# Patient Record
Sex: Male | Born: 1985 | Race: White | Hispanic: No | Marital: Single | Smoking: Current every day smoker
Health system: Southern US, Community
[De-identification: ages and names within clinical notes are randomized; demographics above are authoritative.]

---

## 2007-05-19 ENCOUNTER — Ambulatory Visit: Payer: Self-pay | Admitting: Family Medicine

## 2007-11-01 ENCOUNTER — Ambulatory Visit: Payer: Self-pay | Admitting: Internal Medicine

## 2007-11-06 ENCOUNTER — Ambulatory Visit: Payer: Self-pay | Admitting: Internal Medicine

## 2010-10-25 ENCOUNTER — Emergency Department: Payer: Self-pay | Admitting: Emergency Medicine

## 2013-08-28 ENCOUNTER — Emergency Department: Payer: Self-pay | Admitting: Emergency Medicine

## 2016-12-04 ENCOUNTER — Encounter: Payer: Self-pay | Admitting: Emergency Medicine

## 2016-12-04 ENCOUNTER — Emergency Department: Payer: Self-pay

## 2016-12-04 ENCOUNTER — Emergency Department
Admission: EM | Admit: 2016-12-04 | Discharge: 2016-12-04 | Disposition: A | Discharge status: Home / Self Care | Payer: Self-pay | Attending: Emergency Medicine | Admitting: Emergency Medicine

## 2016-12-04 DIAGNOSIS — T1490XA Injury, unspecified, initial encounter: Secondary | ICD-10-CM

## 2016-12-04 DIAGNOSIS — S32018A Other fracture of first lumbar vertebra, initial encounter for closed fracture: Secondary | ICD-10-CM | POA: Insufficient documentation

## 2016-12-04 DIAGNOSIS — M25552 Pain in left hip: Secondary | ICD-10-CM | POA: Insufficient documentation

## 2016-12-04 DIAGNOSIS — Y939 Activity, unspecified: Secondary | ICD-10-CM | POA: Insufficient documentation

## 2016-12-04 DIAGNOSIS — W1789XA Other fall from one level to another, initial encounter: Secondary | ICD-10-CM | POA: Insufficient documentation

## 2016-12-04 DIAGNOSIS — Y999 Unspecified external cause status: Secondary | ICD-10-CM | POA: Insufficient documentation

## 2016-12-04 DIAGNOSIS — S32009A Unspecified fracture of unspecified lumbar vertebra, initial encounter for closed fracture: Secondary | ICD-10-CM

## 2016-12-04 DIAGNOSIS — S32048A Other fracture of fourth lumbar vertebra, initial encounter for closed fracture: Secondary | ICD-10-CM | POA: Insufficient documentation

## 2016-12-04 DIAGNOSIS — Y929 Unspecified place or not applicable: Secondary | ICD-10-CM | POA: Insufficient documentation

## 2016-12-04 DIAGNOSIS — F17210 Nicotine dependence, cigarettes, uncomplicated: Secondary | ICD-10-CM | POA: Insufficient documentation

## 2016-12-04 DIAGNOSIS — S32038A Other fracture of third lumbar vertebra, initial encounter for closed fracture: Secondary | ICD-10-CM | POA: Insufficient documentation

## 2016-12-04 DIAGNOSIS — S32028A Other fracture of second lumbar vertebra, initial encounter for closed fracture: Secondary | ICD-10-CM | POA: Insufficient documentation

## 2016-12-04 LAB — COMPREHENSIVE METABOLIC PANEL
ALT: 30 U/L (ref 17–63)
ANION GAP: 7 (ref 5–15)
AST: 35 U/L (ref 15–41)
Albumin: 4 g/dL (ref 3.5–5.0)
Alkaline Phosphatase: 48 U/L (ref 38–126)
BILIRUBIN TOTAL: 0.4 mg/dL (ref 0.3–1.2)
BUN: 16 mg/dL (ref 6–20)
CO2: 23 mmol/L (ref 22–32)
Calcium: 9.1 mg/dL (ref 8.9–10.3)
Chloride: 110 mmol/L (ref 101–111)
Creatinine, Ser: 0.74 mg/dL (ref 0.61–1.24)
Glucose, Bld: 123 mg/dL — ABNORMAL HIGH (ref 65–99)
Potassium: 3.6 mmol/L (ref 3.5–5.1)
Sodium: 140 mmol/L (ref 135–145)
TOTAL PROTEIN: 6.3 g/dL — AB (ref 6.5–8.1)

## 2016-12-04 LAB — URINALYSIS, COMPLETE (UACMP) WITH MICROSCOPIC
Bacteria, UA: NONE SEEN
Bilirubin Urine: NEGATIVE
GLUCOSE, UA: NEGATIVE mg/dL
Ketones, ur: 5 mg/dL — AB
Leukocytes, UA: NEGATIVE
NITRITE: NEGATIVE
PH: 5 (ref 5.0–8.0)
Protein, ur: NEGATIVE mg/dL
SPECIFIC GRAVITY, URINE: 1.023 (ref 1.005–1.030)

## 2016-12-04 LAB — CBC
HEMATOCRIT: 39.9 % — AB (ref 40.0–52.0)
Hemoglobin: 14.1 g/dL (ref 13.0–18.0)
MCH: 32.5 pg (ref 26.0–34.0)
MCHC: 35.4 g/dL (ref 32.0–36.0)
MCV: 91.8 fL (ref 80.0–100.0)
PLATELETS: 196 10*3/uL (ref 150–440)
RBC: 4.34 MIL/uL — ABNORMAL LOW (ref 4.40–5.90)
RDW: 12.7 % (ref 11.5–14.5)
WBC: 10.4 10*3/uL (ref 3.8–10.6)

## 2016-12-04 MED ORDER — MORPHINE SULFATE (PF) 4 MG/ML IV SOLN
4.0000 mg | Freq: Once | INTRAVENOUS | Status: AC
  Administered 2016-12-04: 4 mg via INTRAVENOUS
  Filled 2016-12-04: qty 1

## 2016-12-04 MED ORDER — ONDANSETRON HCL 4 MG/2ML IJ SOLN
4.0000 mg | Freq: Once | INTRAMUSCULAR | Status: AC
  Administered 2016-12-04: 4 mg via INTRAVENOUS
  Filled 2016-12-04: qty 2

## 2016-12-04 MED ORDER — HYDROCODONE-ACETAMINOPHEN 5-325 MG PO TABS: 1.0000 | ORAL_TABLET | ORAL | 0 refills | Status: DC | PRN

## 2016-12-04 MED ORDER — IOPAMIDOL (ISOVUE-300) INJECTION 61%
100.0000 mL | Freq: Once | INTRAVENOUS | Status: AC | PRN
  Administered 2016-12-04: 100 mL via INTRAVENOUS

## 2016-12-04 MED ORDER — NAPROXEN 500 MG PO TABS: 500.0000 mg | ORAL_TABLET | Freq: Two times a day (BID) | ORAL | 0 refills | Status: DC

## 2016-12-04 NOTE — ED Notes (Signed)
EDP at bedside at this time.  

## 2016-12-04 NOTE — ED Provider Notes (Signed)
[facility] Emergency Department Provider Note   ____________________________________________    I have reviewed the triage vital signs and the nursing notes.   HISTORY  Chief Complaint Back Pain and Fall     HPI Adrian Hoffman is a 31 y.o. male presents after a fall. Patient reports he fell approximately 25-30 feet while trying to set up his tree stand for hunting. He reports he landed on a following log on the left side of his body. He complains of pain in his left lower back which is severe with any movement. He has been able to ambulate with difficulty secondary to pain. No weakness or numbness in his legs. No saddle anesthesia. No difficulty urinating. No abdominal pain nausea or vomiting. No chest pain or shortness of breath. No neck pain area no head injury. No focal deficits. He has not taken anything for the pain   History reviewed. No pertinent past medical history.  There are no active problems to display for this patient.   History reviewed. No pertinent surgical history.  Prior to Admission medications   Not on File     Allergies Patient has no known allergies.  History reviewed. No pertinent family history.  Social History Social History  Substance Use Topics  . Smoking status: Current Every Day Smoker    Packs/day: 1.00    Types: Cigarettes  . Smokeless tobacco: Never Used  . Alcohol use Yes    Review of Systems  Constitutional: No fever/chills Eyes: No visual changes.  ENT: No sore throat.No neck pain Cardiovascular: Denies chest pain. Respiratory: Denies shortness of breath. Gastrointestinal: No abdominal pain.  No nausea, no vomiting.   Genitourinary: Negative for dysuria. Musculoskeletal: As above Skin: Negative for rash. Neurological: Negative for headaches or weakness or numbness   ____________________________________________   PHYSICAL EXAM:  VITAL SIGNS: ED Triage Vitals  Enc Vitals Group     BP  12/04/16 2041 113/71     Pulse Rate 12/04/16 2041 97     Resp 12/04/16 2041 18     Temp 12/04/16 2041 98.5 F (36.9 C)     Temp Source 12/04/16 2041 Oral     SpO2 12/04/16 2041 97 %     Weight 12/04/16 2042 95.3 kg (210 lb)     Height 12/04/16 2042 1.854 m (6\' 1" )     Head Circumference --      Peak Flow --      Pain Score 12/04/16 2041 10     Pain Loc --      Pain Edu? --      Excl. in GC? --     Constitutional: Alert and oriented.  Eyes: Conjunctivae are normal.  Head: Atraumatic. Nose: No congestion/rhinnorhea. Mouth/Throat: Mucous membranes are moist.   Neck:  Painless ROM, No vertebral tenderness to palpation Cardiovascular: Normal rate, regular rhythm. Grossly normal heart sounds.  Good peripheral circulation. Respiratory: Normal respiratory effort.  No retractions. Lungs CTAB. Gastrointestinal: Soft and nontender. No distention.  No CVA tenderness. Genitourinary: deferred Musculoskeletal: Significant vertebral tenderness to palpation at approximately L2-L3. Warm and well perfused Neurologic:  Normal speech and language. No gross focal neurologic deficits are appreciated. Normal strength in the lower extremities, sensation is grossly normal as well. No saddle anesthesia Skin:  Skin is warm, dry and intact. No rash noted. Psychiatric: Mood and affect are normal. Speech and behavior are normal.  ____________________________________________   LABS (all labs ordered are listed, but only abnormal results are displayed)  Labs Reviewed  CBC - Abnormal; Notable for the following:       Result Value   RBC 4.34 (*)    HCT 39.9 (*)    All other components within normal limits  COMPREHENSIVE METABOLIC PANEL - Abnormal; Notable for the following:    Glucose, Bld 123 (*)    Total Protein 6.3 (*)    All other components within normal limits  URINALYSIS, COMPLETE (UACMP) WITH MICROSCOPIC - Abnormal; Notable for the following:    Color, Urine YELLOW (*)    APPearance CLEAR (*)      Hgb urine dipstick SMALL (*)    Ketones, ur 5 (*)    Squamous Epithelial / LPF 0-5 (*)    All other components within normal limits   ____________________________________________  EKG  None ____________________________________________  RADIOLOGY  L1-L4 left lateral transverse process fractures ____________________________________________   PROCEDURES  Procedure(s) performed: No    Critical Care performed: No ____________________________________________   INITIAL IMPRESSION / ASSESSMENT AND PLAN / ED COURSE  Pertinent labs & imaging results that were available during my care of the patient were reviewed by me and considered in my medical decision making (see chart for details).  Patient presents after a fall with vertebral tenderness. CT scan demonstrates L1-L4 left lateral transverse process fractures, displaced. No neurological findings. IV analgesics given. Discussed with [facility] spine surgeon Dr. Noralee Stain who reports outpatient follow up is appropriate.     ____________________________________________   FINAL CLINICAL IMPRESSION(S) / ED DIAGNOSES  Final diagnoses:  Lumbar transverse process fracture, closed, initial encounter [facility])      NEW MEDICATIONS STARTED DURING THIS VISIT:  New Prescriptions   No medications on file     Note:  This document was prepared using Dragon voice recognition software and may include unintentional dictation errors.    Jene Every, MD 12/04/16 2328

## 2016-12-04 NOTE — ED Notes (Signed)
Patient transported to CT 

## 2016-12-04 NOTE — ED Triage Notes (Signed)
Pt was preparing his deer stand for upcoming hunting season when he accidentally cut the strap that was holding him up in the tree and fell about 28-30 feet; landed supine across a dead tree laying on the ground; c/o low back and hip pain, left hip worse than right; denies bowel/bladder issues; fall occurred around noon today; pain with any movement that has only been eased slightly by heating pad; sensation to lower extremities present

## 2016-12-04 NOTE — ED Notes (Signed)
Pt brought to room by Vernona Rieger, RN. Pt stating that he was up setting up his tree stand when he cut the wrong rope causing him to fall 28 feet or so. Pt stating that he did this around noon. Pt stating that he fell on his back and landed with his back across a log. Pt stating pain primarily in his left lower back that is sharp and shooting. Pt stating pain is worse with deep breaths and when changing position. Pt denying any numbness, tingling, loss of bowels or urine, or inability to urinate or have a BM. Pt denying any blood in urine. Pt appears uncomfortable when moving. Pt's left side of his lower back appears to be slightly edematous.

## 2016-12-04 NOTE — ED Notes (Signed)
Patient verbalizes understanding of discharge instructions, prescriptions, home care and follow up care. Patient out of department at this time. 

## 2019-03-29 IMAGING — CT CT ABD-PELV W/ CM
2 of 5 series · 15 of 46 positions shown, 17 images · IV contrast (APPLIED)
Comparison: CT of the abdomen and pelvis performed 10/26/2010

CLINICAL DATA: Status post fall [DATE] feet, landing on dead tree.
Lower back and bilateral hip pain, worse on the left. Initial
encounter.

EXAM:
CT ABDOMEN AND PELVIS WITHOUT CONTRAST
CT LUMBAR SPINE WITHOUT CONTRAST
TECHNIQUE: Multidetector CT imaging of the abdomen, pelvis and lumbar spine was
performed following the standard protocol without IV contrast.
Multiplanar CT image reconstructions were also generated.

[Series 2: routine abd/pel with · axial · 0.72mm/px · z∈[-535,-70]mm · 12 of 105 slices shown, 14 images]
[im 6/105  soft-tissue]
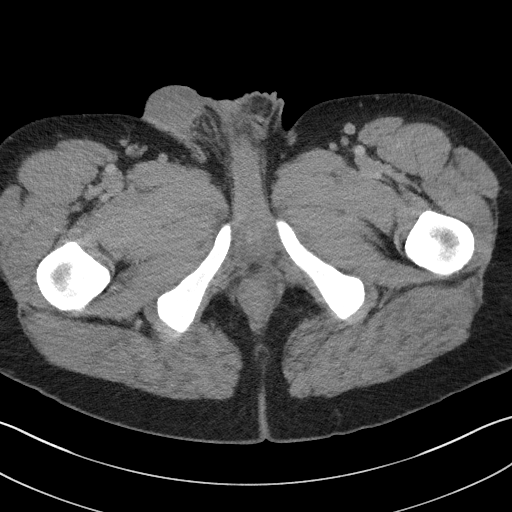
[im 6/105  bone]
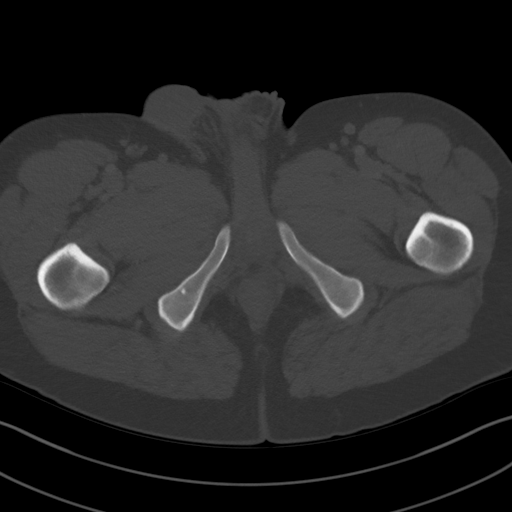
[im 18/105  soft-tissue]
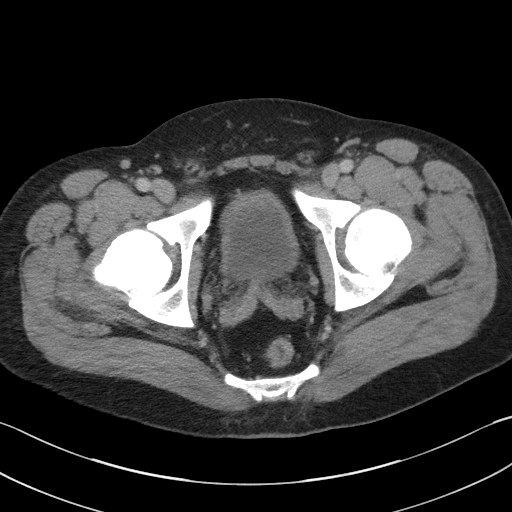
[im 24/105  soft-tissue]
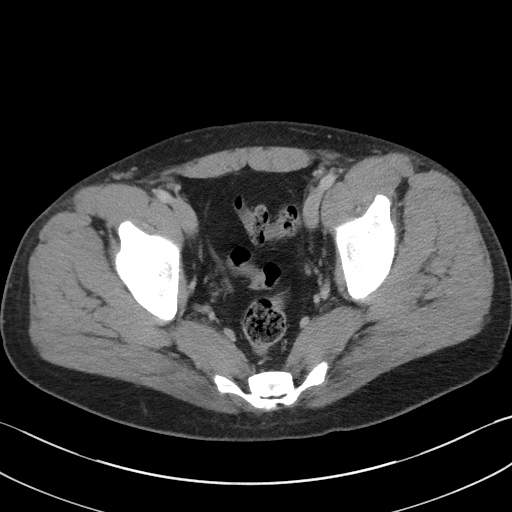
[im 29/105  soft-tissue]
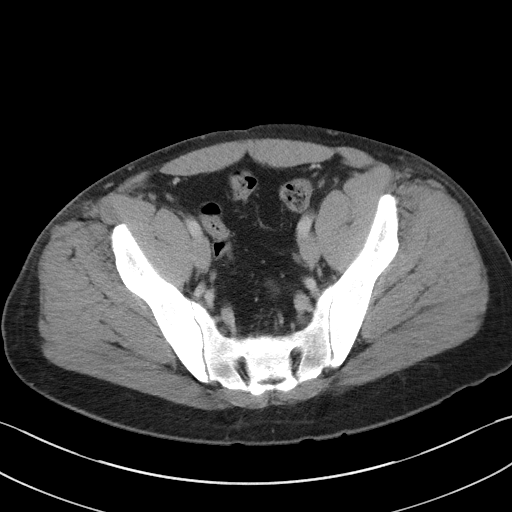
[im 41/105  soft-tissue]
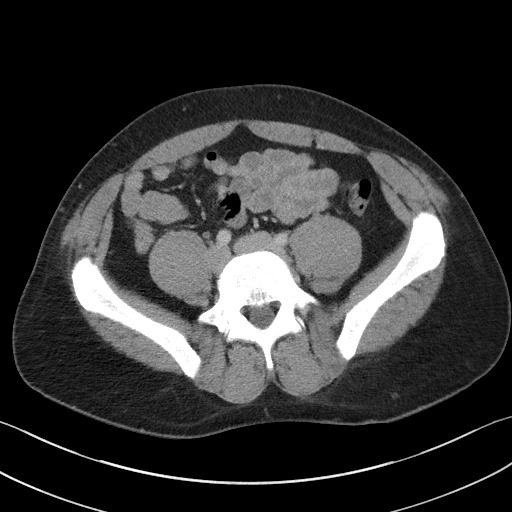
[im 47/105  soft-tissue]
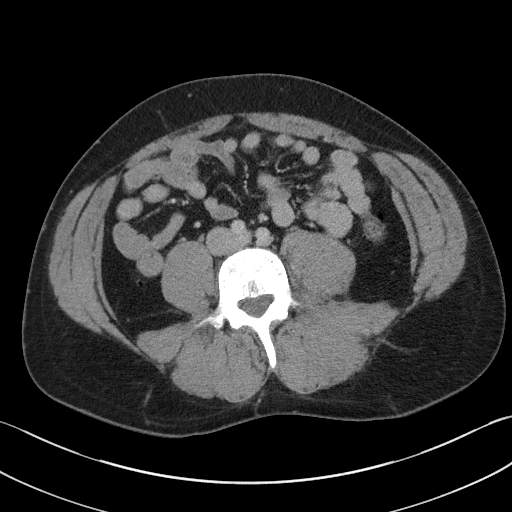
[im 58/105  soft-tissue]
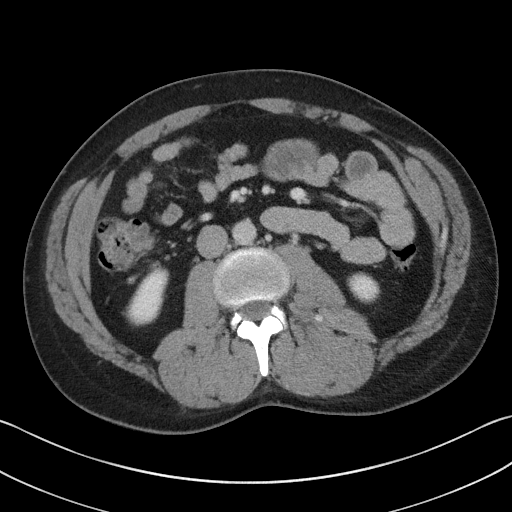
[im 64/105  soft-tissue]
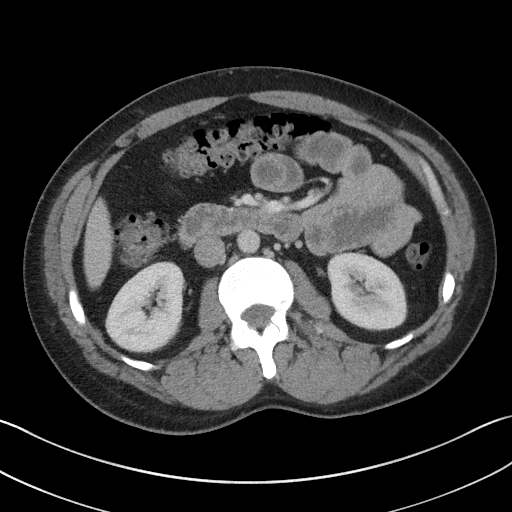
[im 76/105  soft-tissue]
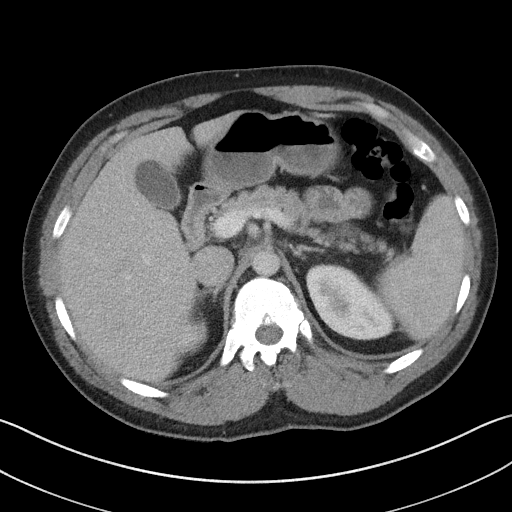
[im 76/105  bone]
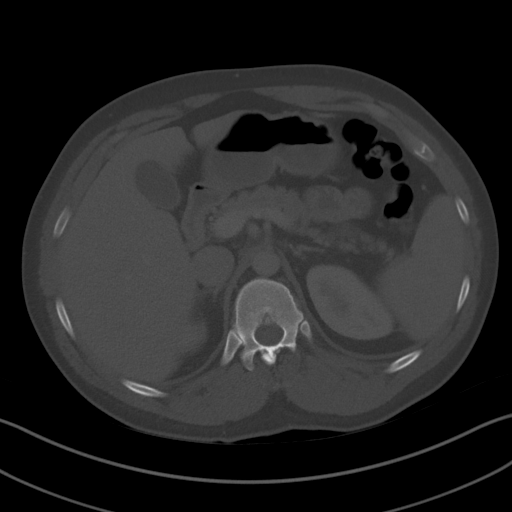
[im 81/105  soft-tissue]
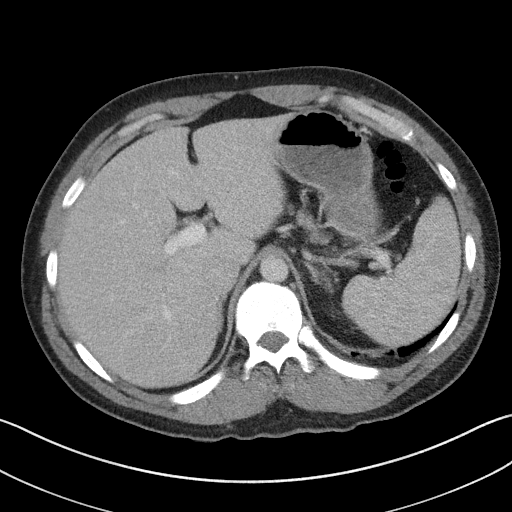
[im 87/105  soft-tissue]
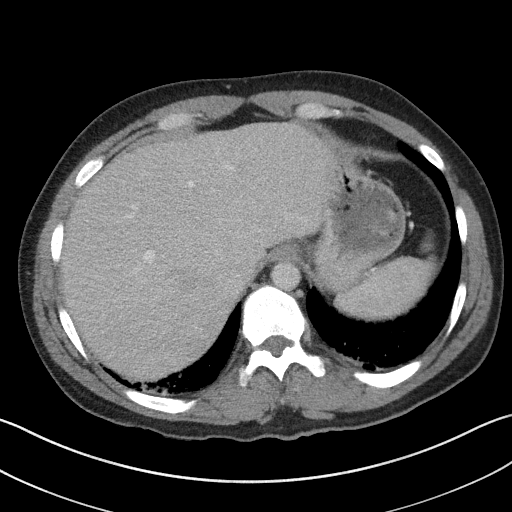
[im 99/105  soft-tissue]
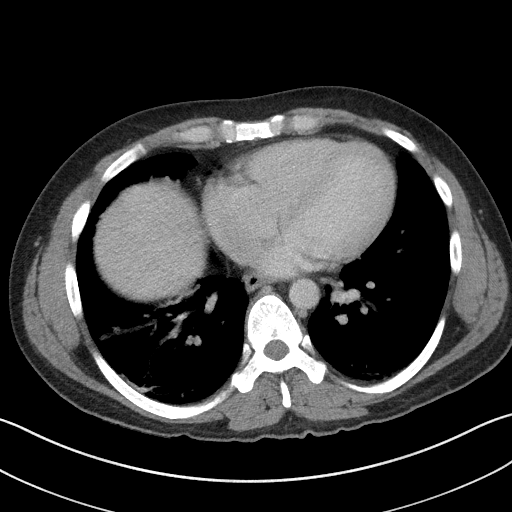

[Series 5: coronal st · coronal · 0.71mm/px · 3 of 89 slices shown]
[im 30/89  soft-tissue]
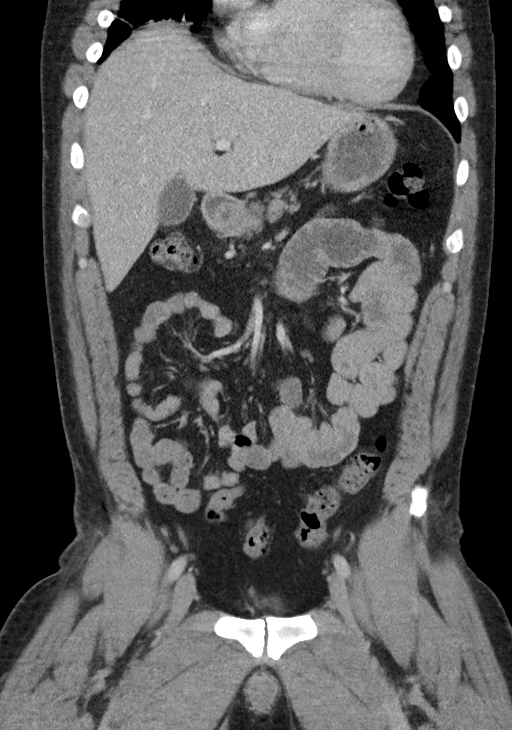
[im 40/89  soft-tissue]
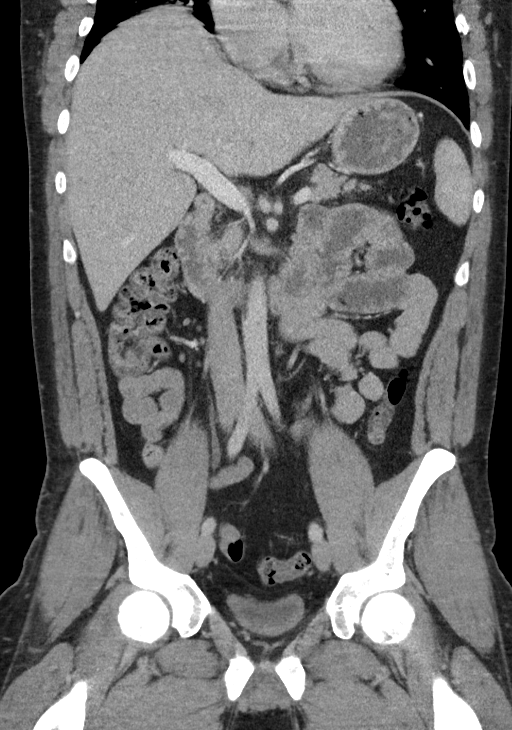
[im 49/89  soft-tissue]
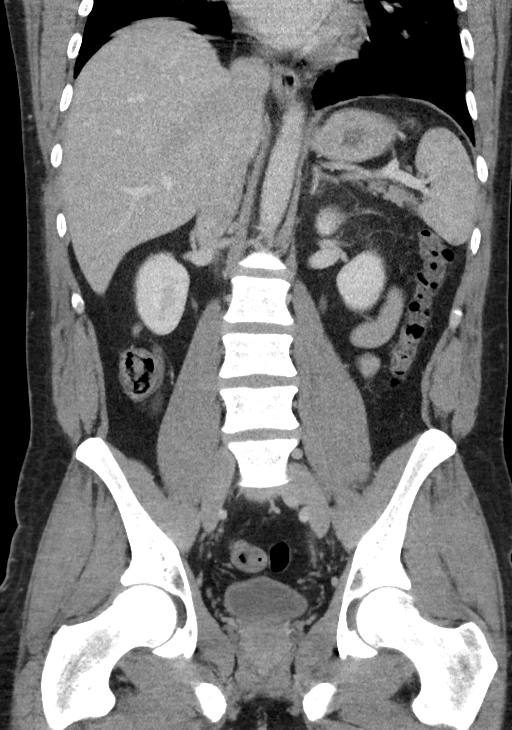

[15 of 46 positions shown; findings below may reference images not displayed]

FINDINGS: CT ABDOMEN AND PELVIS:

Lower chest: Minimal bibasilar atelectasis is noted. The visualized
portions of the mediastinum are unremarkable.

Hepatobiliary: The liver is unremarkable in appearance. The
gallbladder is unremarkable in appearance. The common bile duct
remains normal in caliber.

Pancreas: The pancreas is within normal limits.

Spleen: The spleen is unremarkable in appearance.

Adrenals/Urinary Tract: The adrenal glands are unremarkable in
appearance. The kidneys are within normal limits. There is no
evidence of hydronephrosis. No renal or ureteral stones are
identified. No perinephric stranding is seen.

Stomach/Bowel: The stomach is unremarkable in appearance. The small
bowel is within normal limits. The appendix is normal in caliber,
without evidence of appendicitis. The colon is unremarkable in
appearance.

Vascular/Lymphatic: The abdominal aorta is unremarkable in
appearance. The inferior vena cava is grossly unremarkable. No
retroperitoneal lymphadenopathy is seen. No pelvic sidewall
lymphadenopathy is identified.

Reproductive: Mild bladder wall thickening may reflect relative
decompression. The prostate remains normal in size.

Other: Minimal soft tissue injury is noted at the left lateral
flank.

Musculoskeletal: There are displaced fractures of the left lateral
transverse processes of L1 through L4. Mild surrounding soft tissue
injury is noted. The visualized musculature is unremarkable in
appearance.

CT LUMBAR SPINE:

Aside from the displaced fractures of the left lateral transverse
processes of L1 through L4, there is no evidence of fracture or
subluxation along the lumbar spine. Vertebral bodies demonstrate
normal height and alignment. Intervertebral disc spaces are
preserved. The bony foramina are grossly unremarkable in appearance.

The paraspinal musculature is unremarkable in appearance.
IMPRESSION: 1. Displaced fractures of the left lateral transverse processes of
L1 through L4. Mild surrounding soft tissue injury is noted.
2. Minimal soft tissue injury at the left lateral flank.
3. No additional evidence for traumatic injury to the abdomen or
pelvis.

## 2019-03-29 IMAGING — CT CT L SPINE W/O CM
2 of 3 series · 9 of 33 positions shown, 11 images · IV contrast (APPLIED)
Comparison: CT of the abdomen and pelvis performed 10/26/2010

CLINICAL DATA: Status post fall [DATE] feet, landing on dead tree.
Lower back and bilateral hip pain, worse on the left. Initial
encounter.

EXAM:
CT ABDOMEN AND PELVIS WITHOUT CONTRAST
CT LUMBAR SPINE WITHOUT CONTRAST
TECHNIQUE: Multidetector CT imaging of the abdomen, pelvis and lumbar spine was
performed following the standard protocol without IV contrast.
Multiplanar CT image reconstructions were also generated.

[Series 4: cor bone · coronal · 0.23mm/px · 3 of 54 slices shown]
[im 11/54  bone]
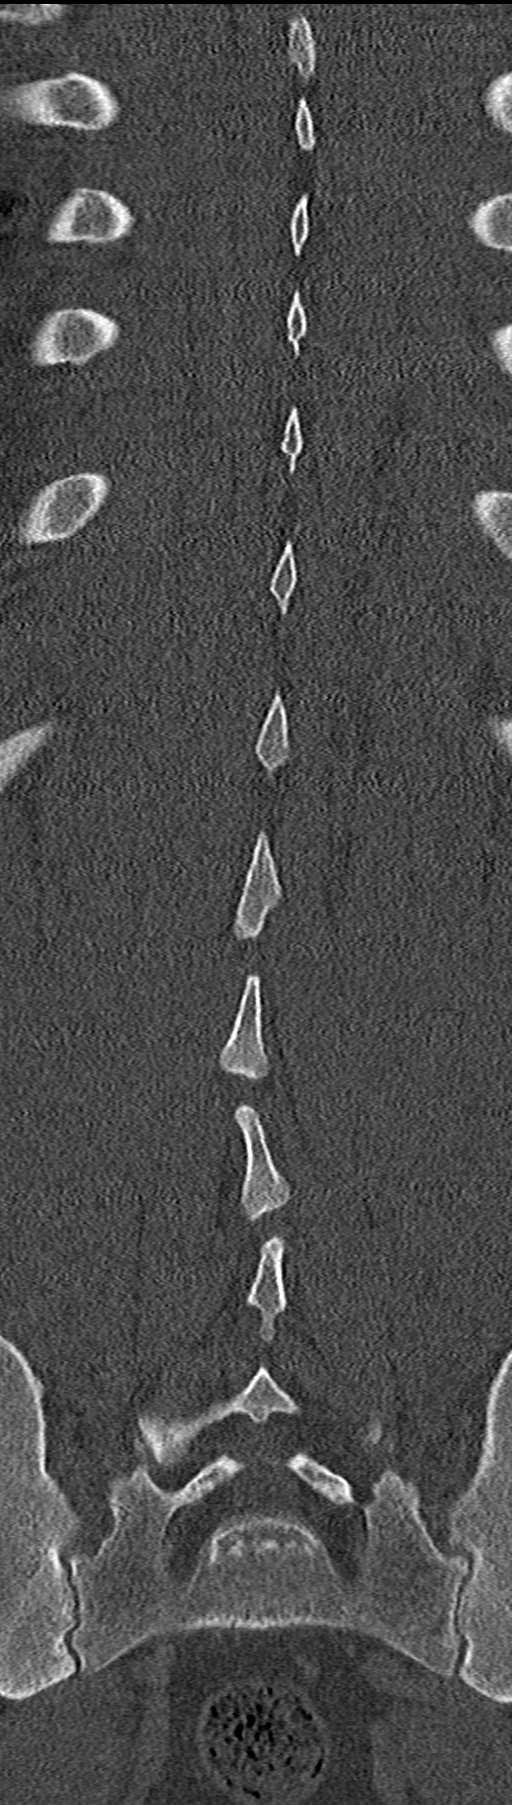
[im 22/54  bone]
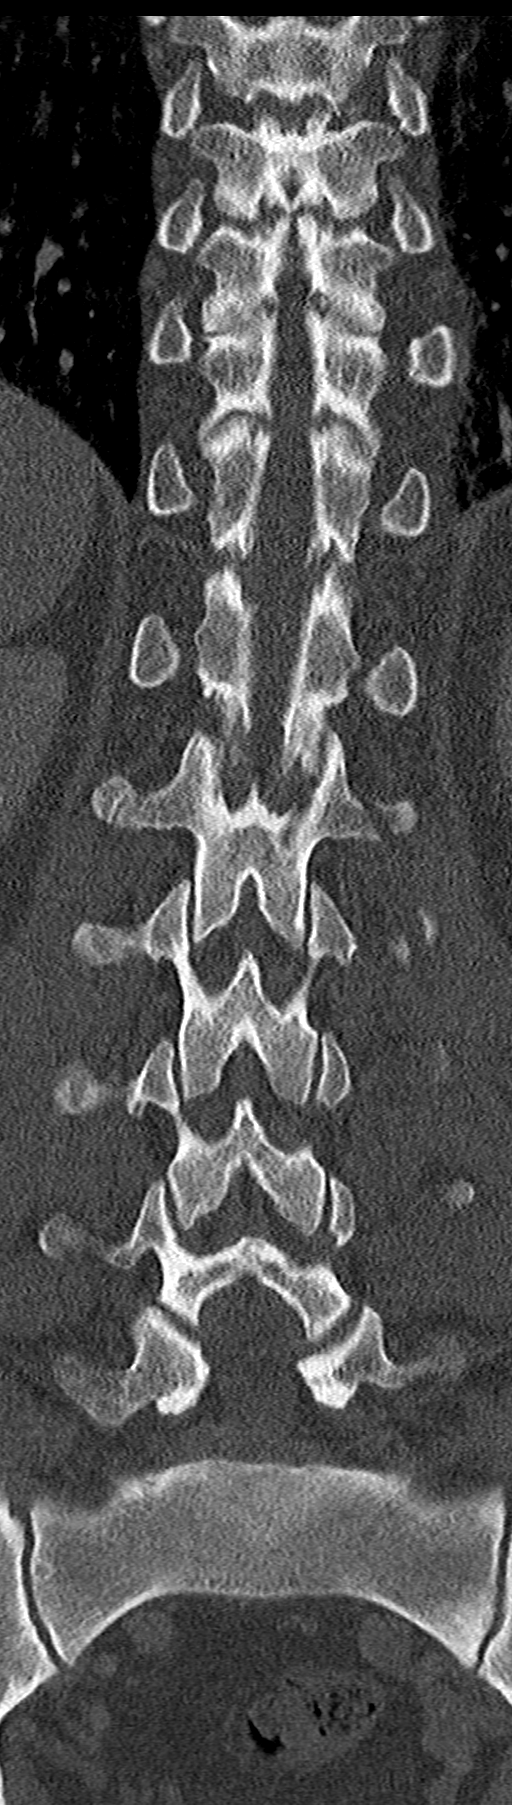
[im 32/54  bone]
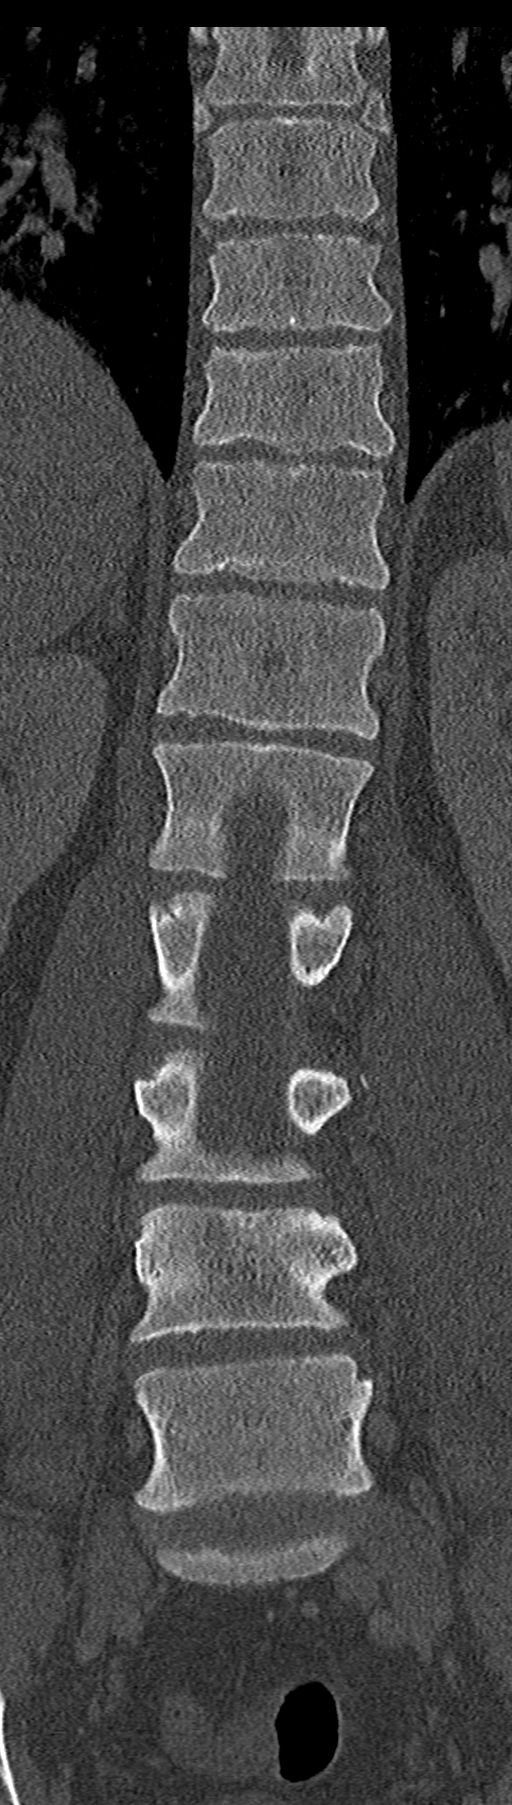

[Series 7: lspine soft 2 · axial · 0.27mm/px · z∈[-387,-99]mm · 6 of 187 slices shown, 8 images]
[im 29/187  soft-tissue]
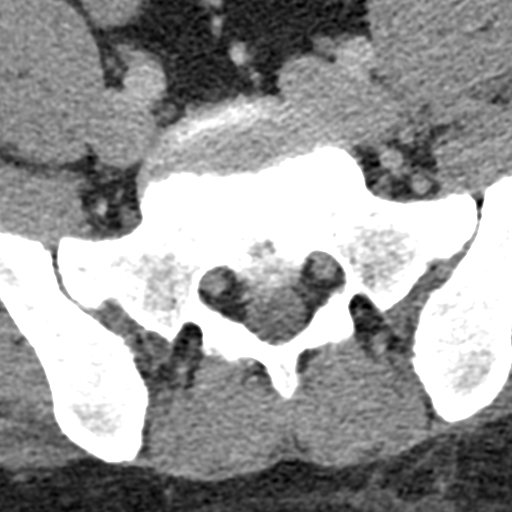
[im 29/187  bone]
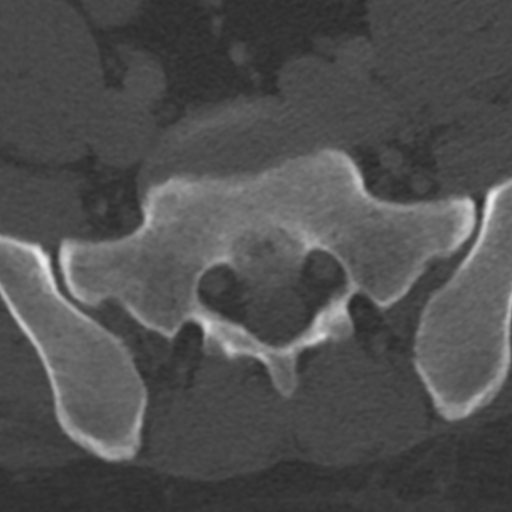
[im 58/187  bone]
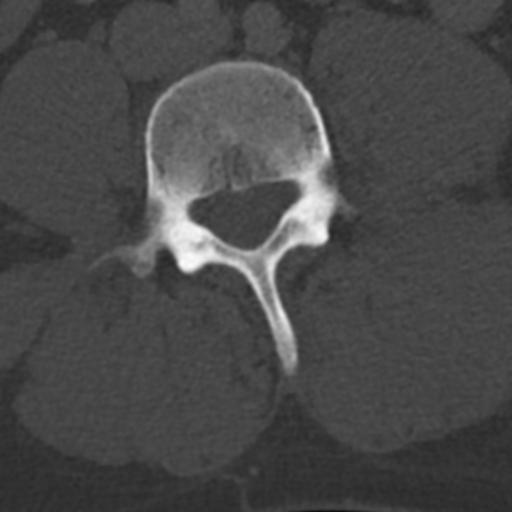
[im 86/187  bone]
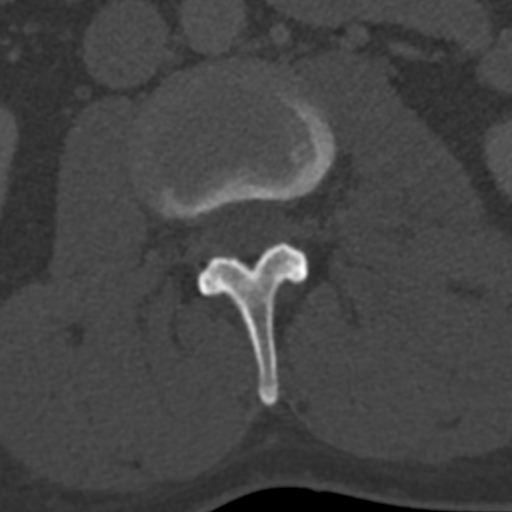
[im 115/187  bone]
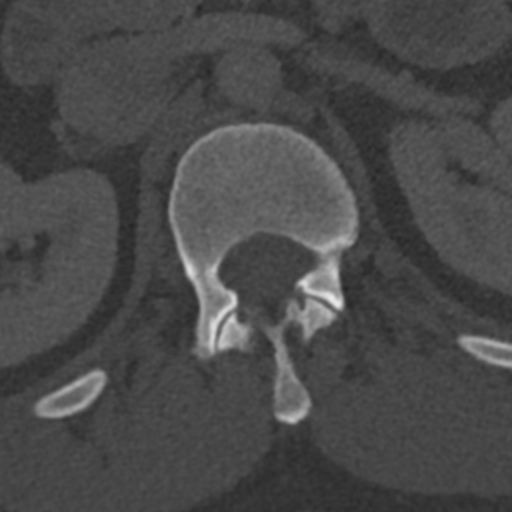
[im 144/187  soft-tissue]
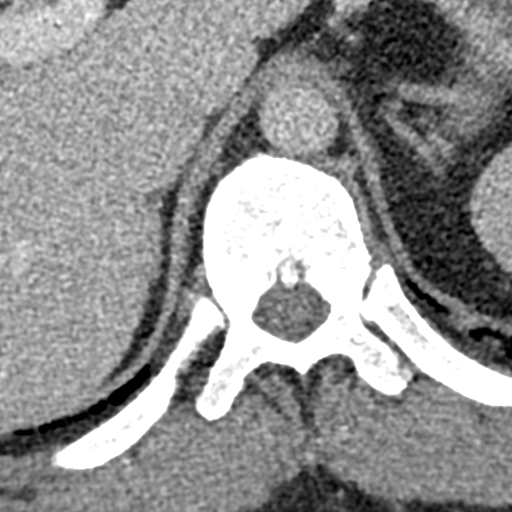
[im 144/187  bone]
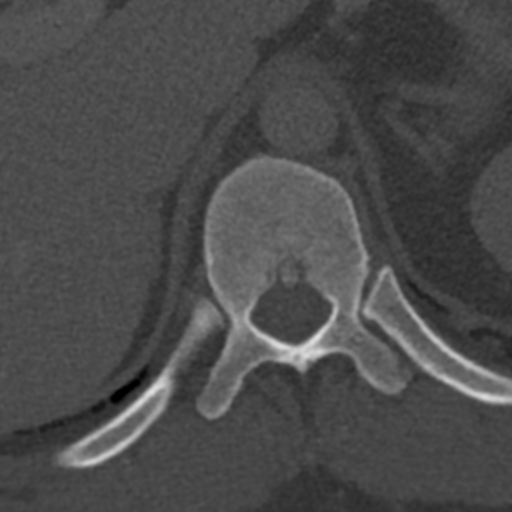
[im 172/187  bone]
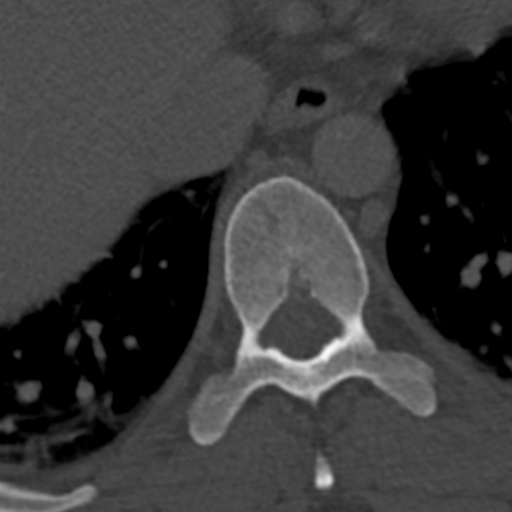

[9 of 33 positions shown; findings below may reference images not displayed]

FINDINGS: CT ABDOMEN AND PELVIS:

Lower chest: Minimal bibasilar atelectasis is noted. The visualized
portions of the mediastinum are unremarkable.

Hepatobiliary: The liver is unremarkable in appearance. The
gallbladder is unremarkable in appearance. The common bile duct
remains normal in caliber.

Pancreas: The pancreas is within normal limits.

Spleen: The spleen is unremarkable in appearance.

Adrenals/Urinary Tract: The adrenal glands are unremarkable in
appearance. The kidneys are within normal limits. There is no
evidence of hydronephrosis. No renal or ureteral stones are
identified. No perinephric stranding is seen.

Stomach/Bowel: The stomach is unremarkable in appearance. The small
bowel is within normal limits. The appendix is normal in caliber,
without evidence of appendicitis. The colon is unremarkable in
appearance.

Vascular/Lymphatic: The abdominal aorta is unremarkable in
appearance. The inferior vena cava is grossly unremarkable. No
retroperitoneal lymphadenopathy is seen. No pelvic sidewall
lymphadenopathy is identified.

Reproductive: Mild bladder wall thickening may reflect relative
decompression. The prostate remains normal in size.

Other: Minimal soft tissue injury is noted at the left lateral
flank.

Musculoskeletal: There are displaced fractures of the left lateral
transverse processes of L1 through L4. Mild surrounding soft tissue
injury is noted. The visualized musculature is unremarkable in
appearance.

CT LUMBAR SPINE:

Aside from the displaced fractures of the left lateral transverse
processes of L1 through L4, there is no evidence of fracture or
subluxation along the lumbar spine. Vertebral bodies demonstrate
normal height and alignment. Intervertebral disc spaces are
preserved. The bony foramina are grossly unremarkable in appearance.

The paraspinal musculature is unremarkable in appearance.
IMPRESSION: 1. Displaced fractures of the left lateral transverse processes of
L1 through L4. Mild surrounding soft tissue injury is noted.
2. Minimal soft tissue injury at the left lateral flank.
3. No additional evidence for traumatic injury to the abdomen or
pelvis.

## 2020-08-06 ENCOUNTER — Ambulatory Visit
Admission: EM | Admit: 2020-08-06 | Discharge: 2020-08-06 | Disposition: A | Discharge status: Home / Self Care | Payer: Self-pay | Attending: Family Medicine | Admitting: Family Medicine

## 2020-08-06 ENCOUNTER — Other Ambulatory Visit: Payer: Self-pay

## 2020-08-06 ENCOUNTER — Encounter: Payer: Self-pay | Admitting: Emergency Medicine

## 2020-08-06 DIAGNOSIS — S0181XA Laceration without foreign body of other part of head, initial encounter: Secondary | ICD-10-CM

## 2020-08-06 NOTE — ED Triage Notes (Signed)
Pt states he was stepping on a ladder, missed the step and his hit chin on the step. This occurred about an hour ago. He has a laceration on his lower chin. Last tetanus was in 2019.

## 2020-08-06 NOTE — ED Provider Notes (Signed)
[facility]    CSN: [id number] Arrival date & time: 08/06/20  1430      History   Chief Complaint Chief Complaint  Patient presents with  . Laceration    chin   HPI  35 year old male presents for laceration.  Patient states that he was on a ladder and missed a step as he was coming down.  He hit his chin and suffered a chin laceration.  Last tetanus was in 2019.  Bleeding is controlled.  Pain 1/10 in severity.  Patient is here for repair.  No other complaints.  Home Medications    Prior to Admission medications   Not on File    Family History History reviewed. No pertinent family history.  Social History Social History   Tobacco Use  . Smoking status: Current Every Day Smoker    Packs/day: 1.00    Types: Cigarettes  . Smokeless tobacco: Never Used  Vaping Use  . Vaping Use: Never used  Substance Use Topics  . Alcohol use: Yes  . Drug use: No     Allergies   Patient has no known allergies.   Review of Systems Review of Systems  Constitutional: Negative.   Skin: Positive for wound.   Physical Exam Triage Vital Signs ED Triage Vitals  Enc Vitals Group     BP 08/06/20 1444 (!) 139/100     Pulse Rate 08/06/20 1444 85     Resp 08/06/20 1444 18     Temp 08/06/20 1444 98.2 F (36.8 C)     Temp Source 08/06/20 1444 Oral     SpO2 08/06/20 1444 98 %     Weight 08/06/20 1445 210 lb 1.6 oz (95.3 kg)     Height 08/06/20 1445 6\' 1"  (1.854 m)     Head Circumference --      Peak Flow --      Pain Score 08/06/20 1445 1     Pain Loc --      Pain Edu? --      Excl. in GC? --      Updated Vital Signs BP (!) 139/100 (BP Location: Left Arm)   Pulse 85   Temp 98.2 F (36.8 C) (Oral)   Resp 18   Ht 6\' 1"  (1.854 m)   Wt 95.3 kg   SpO2 98%   BMI 27.72 kg/m   Visual Acuity Right Eye Distance:   Left Eye Distance:   Bilateral Distance:    Right Eye Near:   Left Eye Near:    Bilateral Near:     Physical Exam Constitutional:       General: He is not in acute distress.    Appearance: Normal appearance. He is not ill-appearing.  HENT:     Head:     Comments: 2.5 cm linear chin laceration. Eyes:     General:        Right eye: No discharge.        Left eye: No discharge.     Conjunctiva/sclera: Conjunctivae normal.  Pulmonary:     Effort: Pulmonary effort is normal. No respiratory distress.  Neurological:     General: No focal deficit present.     Mental Status: He is alert.  Psychiatric:        Mood and Affect: Mood normal.        Behavior: Behavior normal.    UC Treatments / Results  Labs (all labs ordered are listed, but only abnormal results are displayed) Labs Reviewed -  No data to display  EKG   Radiology No results found.  Procedures Laceration Repair  Date/Time: 08/06/2020 4:11 PM Performed by: Tommie Sams, DO Authorized by: Tommie Sams, DO   Consent:    Consent obtained:  Verbal   Consent given by:  Patient Anesthesia:    Anesthesia method:  Local infiltration   Local anesthetic:  Lidocaine 1% WITH epi Laceration details:    Location:  Face   Face location:  Chin   Length (cm):  2.5 Pre-procedure details:    Preparation:  Patient was prepped and draped in usual sterile fashion Exploration:    Hemostasis achieved with:  Direct pressure and epinephrine   Wound exploration: entire depth of wound visualized     Contaminated: no   Treatment:    Area cleansed with:  Povidone-iodine   Amount of cleaning:  Standard Skin repair:    Repair method:  Sutures   Suture size:  5-0   Suture material:  Prolene   Suture technique:  Simple interrupted   Number of sutures:  4 Approximation:    Approximation:  Close Repair type:    Repair type:  Simple Post-procedure details:    Dressing:  Antibiotic ointment   Procedure completion:  Tolerated well, no immediate complications   (including critical care time)  Medications Ordered in UC Medications - No data to display  Initial  Impression / Assessment and Plan / UC Course  I have reviewed the triage vital signs and the nursing notes.  Pertinent labs & imaging results that were available during my care of the patient were reviewed by me and considered in my medical decision making (see chart for details).    35 year old male presents with a chin laceration.  Repaired as above.  Sutures out in 7 to 10 days.  Supportive care.  Final Clinical Impressions(s) / UC Diagnoses   Final diagnoses:  Chin laceration, initial encounter     Discharge Instructions     Sutures out in 7-10 days.  Take care  Dr. Adriana Simas    ED Prescriptions    None     PDMP not reviewed this encounter.   Tommie Sams, [place] 08/06/20 1616

## 2020-08-06 NOTE — Discharge Instructions (Addendum)
Sutures out in 7-10 days.  Take care  Dr. Adriana Simas

## 2021-11-24 ENCOUNTER — Ambulatory Visit (INDEPENDENT_AMBULATORY_CARE_PROVIDER_SITE_OTHER): Payer: Self-pay

## 2021-11-24 ENCOUNTER — Ambulatory Visit
Admission: EM | Admit: 2021-11-24 | Discharge: 2021-11-24 | Disposition: A | Discharge status: Home / Self Care | Payer: Self-pay | Attending: Emergency Medicine | Admitting: Emergency Medicine

## 2021-11-24 DIAGNOSIS — S5002XA Contusion of left elbow, initial encounter: Secondary | ICD-10-CM

## 2021-11-24 DIAGNOSIS — M25522 Pain in left elbow: Secondary | ICD-10-CM

## 2021-11-24 DIAGNOSIS — W19XXXA Unspecified fall, initial encounter: Secondary | ICD-10-CM

## 2021-11-24 NOTE — ED Provider Notes (Signed)
[facility]    CSN: [id number] Arrival date & time: 11/24/21  1454      History   Chief Complaint Chief Complaint  Patient presents with   Fall    HPI Adrian Hoffman is a 36 y.o. male.   HPI  36 year old male here for evaluation of left elbow pain.  Patient reports that he was Fishing at 2:00 in the morning when he fell back and struck his left elbow on concrete.  He states that initially there was bruising and swelling, both of which have pretty much resolved.  He is here today for evaluation because he still has pain when he applies pressure to his elbow.  He denies any pain with range of motion and he also denies numbness or tingling in his fingers.  History reviewed. No pertinent past medical history.  There are no problems to display for this patient.   History reviewed. No pertinent surgical history.     Home Medications    Prior to Admission medications   Not on File    Family History History reviewed. No pertinent family history.  Social History Social History   Tobacco Use   Smoking status: Every Day    Packs/day: 1.00    Types: Cigarettes   Smokeless tobacco: Never  Vaping Use   Vaping Use: Never used  Substance Use Topics   Alcohol use: Yes   Drug use: No     Allergies   Patient has no known allergies.   Review of Systems Review of Systems  Musculoskeletal:  Positive for arthralgias. Negative for joint swelling.  Skin:  Negative for color change.  Neurological:  Negative for weakness and numbness.  Hematological: Negative.   Psychiatric/Behavioral: Negative.       Physical Exam Triage Vital Signs ED Triage Vitals  Enc Vitals Group     BP 11/24/21 1508 (!) 130/109     Pulse Rate 11/24/21 1508 94     Resp 11/24/21 1508 17     Temp 11/24/21 1508 98.1 F (36.7 C)     Temp Source 11/24/21 1508 Oral     SpO2 11/24/21 1508 99 %     Weight 11/24/21 1509 235 lb (106.6 kg)     Height 11/24/21 1509 6\' 1"  (1.854 m)      Head Circumference --      Peak Flow --      Pain Score 11/24/21 1508 1     Pain Loc --      Pain Edu? --      Excl. in [place]? --    No data found.  Updated Vital Signs BP (!) 130/109   Pulse 94   Temp 98.1 F (36.7 C) (Oral)   Resp 17   Ht 6\' 1"  (1.854 m)   Wt 235 lb (106.6 kg)   SpO2 99%   BMI 31.00 kg/m   Visual Acuity Right Eye Distance:   Left Eye Distance:   Bilateral Distance:    Right Eye Near:   Left Eye Near:    Bilateral Near:     Physical Exam Vitals and nursing note reviewed.  Constitutional:      Appearance: Normal appearance. He is not ill-appearing.  HENT:     Head: Normocephalic and atraumatic.  Musculoskeletal:        General: Tenderness and signs of injury present. No swelling or deformity. Normal range of motion.  Skin:    General: Skin is warm and dry.  Capillary Refill: Capillary refill takes less than 2 seconds.     Findings: No bruising or erythema.  Neurological:     General: No focal deficit present.     Mental Status: He is alert and oriented to person, place, and time.     Sensory: No sensory deficit.     Motor: No weakness.  Psychiatric:        Mood and Affect: Mood normal.        Behavior: Behavior normal.        Thought Content: Thought content normal.        Judgment: Judgment normal.      UC Treatments / Results  Labs (all labs ordered are listed, but only abnormal results are displayed) Labs Reviewed - No data to display  EKG   Radiology DG Elbow Complete Left  Result Date: 11/24/2021 CLINICAL DATA:  Trauma, fall, pain EXAM: LEFT ELBOW - COMPLETE 3+ VIEW COMPARISON:  None Available. FINDINGS: There is no evidence of fracture, dislocation, or joint effusion. There is no evidence of arthropathy or other focal bone abnormality. Soft tissues are unremarkable. IMPRESSION: No fracture or dislocation is seen in left elbow. Electronically Signed   By: Ernie Avena M.D.   On: 11/24/2021 16:14    Procedures Procedures  (including critical care time)  Medications Ordered in UC Medications - No data to display  Initial Impression / Assessment and Plan / UC Course  I have reviewed the triage vital signs and the nursing notes.  Pertinent labs & imaging results that were available during my care of the patient were reviewed by me and considered in my medical decision making (see chart for details).  Patient is a very pleasant, nontoxic-appearing 41 old male here for evaluation of 11 days with left elbow pain as outlined in HPI above.  The initial insult was a fall onto concrete.  Patient reports that he fell back and landed on his bottom and then continue to fall back and strike his left elbow on concrete.  He states that he has been able to continue to work he does not have any pain with range of motion but he does have pain when he puts pressure on his elbow.  He denies numbness, tingling, or weakness in his left hand or arm.  He has no changes to range of motion.  On exam patient's left elbow is in normal anatomical alignment.  Distal radial ulnar pulses are 2+ and patient's grip strength in his left hand is 5/5.  There is no pain with palpation of the olecranon process, medial or lateral condyle of the humerus, or back palpation of the popliteal fossa.  There is a firm piece that floats near the olecranon process that patient reports is painful when he finds it and presses on it.  Patient does have full flexion and extension of the elbow.  No changes to pronation or supination of the left wrist.  I will obtain radiograph of the left elbow to look for any bony abnormality.  I suspect patient caused traumatic bursitis as a result of his impacted fall and residual inflammation is still causing his pain.  Left elbow x-rays independently reviewed and evaluated by me.  Impression: There is no evidence of dislocation or fracture.  There is a small osteophyte projecting off of the proximal end of the olecranon process.  This is  smooth contours and does not appear to be new.  There are some mild soft tissue swelling.  Radiology overread  is pending. Radiology impression states no fracture or dislocation in the left elbow.  I will discharge patient home with a diagnosis of contusion to his left elbow.  I will have him use over-the-counter ibuprofen and Tylenol as needed for pain.  Final Clinical Impressions(s) / UC Diagnoses   Final diagnoses:  Contusion of left elbow, initial encounter     Discharge Instructions      Your x-rays today did not demonstrate any broken bones and no dislocation of bones.  As I mentioned before, you may have caused a little traumatic bursitis and the pain you are feeling is the inflamed tissues continuing to resolve.  Use over-the-counter Tylenol and ibuprofen according to package instructions as needed for pain.  You can apply moist heat to your elbow for 20 minutes at a time 2-3 times a day to help improve blood flow and aid in healing.     ED Prescriptions   None    PDMP not reviewed this encounter.   Becky Augusta, NP 11/24/21 1622

## 2021-11-24 NOTE — Discharge Instructions (Addendum)
Your x-rays today did not demonstrate any broken bones and no dislocation of bones.  As I mentioned before, you may have caused a little traumatic bursitis and the pain you are feeling is the inflamed tissues continuing to resolve.  Use over-the-counter Tylenol and ibuprofen according to package instructions as needed for pain.  You can apply moist heat to your elbow for 20 minutes at a time 2-3 times a day to help improve blood flow and aid in healing.

## 2021-11-24 NOTE — ED Triage Notes (Signed)
Patient to Urgent Care with complaints of left elbow pain. Reports falling on wet concrete last Friday, states he was drinking and outside catfishing at 2am.

## 2022-01-04 ENCOUNTER — Emergency Department: Payer: Medicaid Other

## 2022-01-04 ENCOUNTER — Other Ambulatory Visit: Payer: Self-pay

## 2022-01-04 ENCOUNTER — Emergency Department
Admission: EM | Admit: 2022-01-04 | Discharge: 2022-01-04 | Disposition: A | Discharge status: Home / Self Care | Payer: Medicaid Other | Attending: Emergency Medicine | Admitting: Emergency Medicine

## 2022-01-04 DIAGNOSIS — W294XXA Contact with nail gun, initial encounter: Secondary | ICD-10-CM | POA: Diagnosis not present

## 2022-01-04 DIAGNOSIS — S60552A Superficial foreign body of left hand, initial encounter: Secondary | ICD-10-CM | POA: Insufficient documentation

## 2022-01-04 DIAGNOSIS — F17210 Nicotine dependence, cigarettes, uncomplicated: Secondary | ICD-10-CM | POA: Diagnosis not present

## 2022-01-04 DIAGNOSIS — S65402A Unspecified injury of blood vessel of left thumb, initial encounter: Secondary | ICD-10-CM | POA: Diagnosis present

## 2022-01-04 MED ORDER — CEFAZOLIN (ANCEF) 1 G IV SOLR: 1.0000 g | INTRAVENOUS | Status: DC

## 2022-01-04 MED ORDER — MORPHINE SULFATE (PF) 4 MG/ML IV SOLN
4.0000 mg | Freq: Once | INTRAVENOUS | Status: AC
  Administered 2022-01-04: 4 mg via INTRAVENOUS
  Filled 2022-01-04: qty 1

## 2022-01-04 MED ORDER — OXYCODONE HCL 5 MG PO TABS
5.0000 mg | ORAL_TABLET | Freq: Once | ORAL | Status: AC
  Administered 2022-01-04: 5 mg via ORAL
  Filled 2022-01-04: qty 1

## 2022-01-04 MED ORDER — CEPHALEXIN 500 MG PO CAPS: 500.0000 mg | ORAL_CAPSULE | Freq: Three times a day (TID) | ORAL | 0 refills | Status: AC

## 2022-01-04 MED ORDER — HYDROCODONE-ACETAMINOPHEN 5-325 MG PO TABS: 1.0000 | ORAL_TABLET | Freq: Four times a day (QID) | ORAL | 0 refills | Status: AC | PRN

## 2022-01-04 MED ORDER — CEFAZOLIN SODIUM-DEXTROSE 1-4 GM/50ML-% IV SOLN: 1.0000 g | Freq: Once | INTRAVENOUS | Status: DC

## 2022-01-04 MED ORDER — CEFAZOLIN SODIUM-DEXTROSE 1-4 GM/50ML-% IV SOLN
1.0000 g | Freq: Once | INTRAVENOUS | Status: AC
  Administered 2022-01-04: 1 g via INTRAVENOUS
  Filled 2022-01-04: qty 50

## 2022-01-04 NOTE — ED Notes (Signed)
Dressing applied to L hand.

## 2022-01-04 NOTE — ED Notes (Signed)
Tool box in RM

## 2022-01-04 NOTE — Discharge Instructions (Addendum)
Clean left hand daily with mild soap and water and watch for any signs of infection.  Begin taking antibiotics 3 times a day for the next 7 days to prevent infection to the wound.  Hydrocodone as needed for pain.  Do not drive or operate machinery while taking this medication.  If you see any signs of infection or fever, chills, redness or increased pain return to the emergency department immediately for reevaluation.

## 2022-01-04 NOTE — ED Provider Notes (Signed)
[facility] Provider Note    Event Date/Time   First MD Initiated Contact with Patient 01/04/22 [phone]     (approximate)   History   Hand Injury   HPI  Adrian Hoffman is a 36 y.o. male   to the ED after a nail gun went off resulting in a nail into his left thumb.  Patient reports that he is up-to-date on tetanus and immunizations.  Denies any health problems and smokes 1 pack of cigarettes per day.       Physical Exam   Triage Vital Signs: ED Triage Vitals  Enc Vitals Group     BP 01/04/22 0850 (!) 149/101     Pulse Rate 01/04/22 0850 97     Resp 01/04/22 0850 18     Temp 01/04/22 0850 97.9 F (36.6 C)     Temp Source 01/04/22 0850 Oral     SpO2 01/04/22 0850 97 %     Weight 01/04/22 0850 235 lb (106.6 kg)     Height 01/04/22 0850 6\' 1"  (1.854 m)     Head Circumference --      Peak Flow --      Pain Score 01/04/22 0848 5     Pain Loc --      Pain Edu? --      Excl. in [place]? --     Most recent vital signs: Vitals:   01/04/22 0850  BP: (!) 149/101  Pulse: 97  Resp: 18  Temp: 97.9 F (36.6 C)  SpO2: 97%     General: Awake, no distress.  CV:  Good peripheral perfusion.  Resp:  Normal effort.  Abd:  No distention.  Other:  Left thumb lateral aspect with foreign body present.  Entrance of a nail from a nail gun is at the proximal aspect of the left thumb.  No exit.  Capillary refill is less than 3 seconds and motor or sensory function intact.   ED Results / Procedures / Treatments   Labs (all labs ordered are listed, but only abnormal results are displayed) Labs Reviewed - No data to display    RADIOLOGY  X-ray left thumb reviewed and interpreted by myself independent of the radiologist and notes that there is a nail present in the soft tissue and does not appear to be involving the bone.  Radiology report was reviewed and also agreed.   PROCEDURES:  Critical Care performed:   .Foreign Body Removal  Date/Time: 01/04/2022  10:00 AM  Performed by: Johnn Hai, PA-C Authorized by: Johnn Hai, PA-C  Consent: Verbal consent obtained. Consent given by: patient Patient identity confirmed: verbally with patient Body area: skin General location: upper extremity Location details: left thumb  Sedation: Patient sedated: no  Patient restrained: no Dressing: dressing applied 1 objects recovered. Objects recovered: nail Post-procedure assessment: foreign body removed Comments: Patient able to flex and extend digit without difficulty.  Bleeding controlled with direct pressure.     MEDICATIONS ORDERED IN ED: Medications  oxyCODONE (Oxy IR/ROXICODONE) immediate release tablet 5 mg (5 mg Oral Given 01/04/22 0905)  morphine (PF) 4 MG/ML injection 4 mg (4 mg Intravenous Given 01/04/22 1049)  ceFAZolin (ANCEF) IVPB 1 g/50 mL premix (0 g Intravenous Stopped 01/04/22 1116)     IMPRESSION / MDM / ASSESSMENT AND PLAN / ED COURSE  I reviewed the triage vital signs and the nursing notes.   Differential diagnosis includes, but is not limited to, foreign body left thumb, puncture  wound, bone injury.  36 year old male presents to the ED after shooting a nail into his thumb with a nail gun while at work.  X-rays were obtained and patient was made aware that this was in the soft tissue and did not involve the bone.  Patient was given oxycodone IR 5 mg p.o. while waiting for x-ray.  Nail was removed without any complications.  Hand was soaked in a solution of sterile water and Betadine and then dressing applied.  Patient received Ancef 1 g IV along with morphine 4 mg IV while in the ED.  Patient was given instructions on how to care for this.  He is also aware that prescription for antibiotic and pain medication was sent to the pharmacy.  He was also given strict return precautions should the puncture area appear to be getting infected, increased pain, fever or chills he is to return to the emergency department  immediately.      Patient's presentation is most consistent with acute complicated illness / injury requiring diagnostic workup.  FINAL CLINICAL IMPRESSION(S) / ED DIAGNOSES   Final diagnoses:  Foreign body of left hand, initial encounter     Rx / DC Orders   ED Discharge Orders          Ordered    cephALEXin (KEFLEX) 500 MG capsule  3 times daily        01/04/22 1130    HYDROcodone-acetaminophen (NORCO/VICODIN) 5-325 MG tablet  Every 6 hours PRN        01/04/22 1130             Note:  This document was prepared using Dragon voice recognition software and may include unintentional dictation errors.   Tommi Rumps, PA-C 01/04/22 1224    Minna Antis, MD 01/04/22 [id number]

## 2022-01-04 NOTE — ED Triage Notes (Signed)
Pt comes with c/o nail in left thumb. Pt states this just happened at work.

## 2024-02-16 ENCOUNTER — Encounter (HOSPITAL_BASED_OUTPATIENT_CLINIC_OR_DEPARTMENT_OTHER): Payer: Self-pay | Admitting: Internal Medicine

## 2024-02-16 DIAGNOSIS — I10 Essential (primary) hypertension: Secondary | ICD-10-CM

## 2024-02-16 DIAGNOSIS — R0683 Snoring: Secondary | ICD-10-CM

## 2024-03-06 ENCOUNTER — Ambulatory Visit (HOSPITAL_BASED_OUTPATIENT_CLINIC_OR_DEPARTMENT_OTHER): Attending: Nurse Practitioner | Admitting: Internal Medicine

## 2024-03-06 DIAGNOSIS — G4733 Obstructive sleep apnea (adult) (pediatric): Secondary | ICD-10-CM | POA: Insufficient documentation

## 2024-03-06 DIAGNOSIS — R0683 Snoring: Secondary | ICD-10-CM

## 2024-03-06 DIAGNOSIS — I10 Essential (primary) hypertension: Secondary | ICD-10-CM

## 2024-03-11 NOTE — Procedures (Signed)
%%  startinterp%% Indications for Polysomnography The patient is a 38 year-old Male who is 6' 1 and weighs 250.0 lbs. His BMI equals 33.1.  A home sleep apnea test was performed to evaluate for -.  MedicationNo Data. Polysomnogram Data A home sleep test recorded the standard physiologic parameters including EKG, nasal and oral airflow.  Respiratory parameters of chest and abdominal movements were recorded with Respiratory Inductance Plethysmography belts.  Oxygen saturation was  recorded by pulse oximetry.  Study Architecture The total recording time of the polysomnogram was 373.7 minutes.  The total monitoring time was 374.0 minutes.  Time spent in Supine position was 1.0 minutes.  Respiratory Events The study revealed a presence of 17 obstructive, - central, and - mixed apneas resulting in an Apnea index of 2.7 events per hour.  There were 101 hypopneas (GreaterEqual to3% desaturation and/or arousal) resulting in an Apnea\Hypopnea Index (AHI  GreaterEqual to3% desaturation and/or arousal) of 18.9 events per hour.  There were 84 hypopneas (GreaterEqual to4% desaturation) resulting in an Apnea\Hypopnea Index (AHI GreaterEqual to4% desaturation) of 16.2 events per hour.  There were - Respiratory  Effort Related Arousals resulting in a RERA index of - events per hour. The Respiratory Disturbance Index is 18.9 events per hour.  The snore index was - events per hour.  Mean oxygen saturation was 93.4%.  The lowest oxygen saturation during monitoring time was 79.0%.  Time spent LessEqual to88% oxygen saturation was  minutes ().  Cardiac Summary The average pulse rate was 86.7 bpm.  The minimum pulse rate was 56.0 bpm while the maximum pulse rate was 118.0 bpm.  Cardiac rhythm was normal/abnormal.  Comments:  Diagnosis:  Recommendations:   This study was personally reviewed and electronically signed by: Dr.Yarelly Kuba D Youn Accredited Board Certified in Sleep Medicine Date/Time:

## 2024-03-11 NOTE — Procedures (Signed)
 [facility] [address] [place], [place] [address] Tel: [phone]   Fax: [phone]  Home Sleep Test Interpretation  Patient Name: Adrian Hoffman, Adrian Hoffman Study Date: 03/07/2024  Date of Birth: 1985/07/14 Study Type: HST  Age: 38 year MRN #: [id number]  Sex: Male Interpreting Physician: NEYSA RAMA, [id number]  Height: 6' 1 Referring Physician: TESS GREEN [phone])  Weight: 250.0 lbs Recording Tech: Will Poet RRT RPSGT RST  BMI: 33.1 Scoring Tech: Will Poet RRT RPSGT RST  ESS: 6 Neck Size: 16.5  %%startinterp%% %%startinterp%% Indications for Polysomnography The patient is a 38 year-old Male who is 6' 1 and weighs 250.0 lbs. His BMI equals 33.1.  A home sleep apnea test was performed to evaluate for -.OSA  Medication  No Data.   Polysomnogram Data A home sleep test recorded the standard physiologic parameters including EKG, nasal and oral airflow.  Respiratory parameters of chest and abdominal movements were recorded with Respiratory Inductance Plethysmography belts.  Oxygen saturation was recorded by pulse oximetry.   Study Architecture The total recording time of the polysomnogram was 373.7 minutes.  The total monitoring time was 374.0 minutes.  Time spent in Supine position was 1.0 minutes.   Respiratory Events The study revealed a presence of 17 obstructive, - central, and - mixed apneas resulting in an Apnea index of 2.7 events per hour.  There were 101 hypopneas (>=3% desaturation and/or arousal) resulting in an Apnea\Hypopnea Index (AHI >=3% desaturation and/or arousal) of 18.9 events per hour.  There were 84 hypopneas (>=4% desaturation) resulting in an Apnea\Hypopnea Index (AHI >=4% desaturation) of 16.2 events per hour.  There were - Respiratory Effort Related Arousals resulting in a RERA index of - events per hour. The Respiratory Disturbance Index is 18.9 events per hour.  The snore index was - events per hour.  Mean  oxygen saturation was 93.4%.  The lowest oxygen saturation during monitoring time was 79.0%.  Time spent <=88% oxygen saturation was 21.0 minutes (5.6%).  Cardiac Summary The average pulse rate was 86.7 bpm.  The minimum pulse rate was 56.0 bpm while the maximum pulse rate was 118.0 bpm.    Comments: Moderate obstructive sleep apnea, AHI (3%) 18.9/hr. Snoring with oxygen desaturation to a nadir of 79%, mean 93.4%.  Diagnosis: Obstructive sleep apnea  Recommendations:  Suggest autopap 5-2,  CPAP titration sleep study or ENT surgical evaluation.   This study was personally reviewed and electronically signed by: Dr .Rama JONETTA Neysa Accredited Board Certified in Sleep Medicine Date/Time: 03/11/24  12:07    %%endinterp%% %%endinterp%%    Study Overview  Recording Time: 422.9 min. Monitoring Time: 374.0 min.  Analysis Start:  12:24:45 AM Supine Time: 1.0 min.  Analysis Stop:  06:38:25 AM     Study Summary   Count Index Longest Event Duration  Apneas & Hypopneas: 118 18.9  Apneas: 28.0 sec.     Hypopneas: 88.4 sec.  RERAs: - - - sec.  Desaturations: 116 18.6 90.4 sec.  Snores: - - - sec.    Minimum Oxygen Saturation: 79.0%    Respiratory Summary   Total Duration Supine Non-Supine   Count Index Average Longest Count Index Count Index  Obstructive Apnea 17 2.7 16.8 28.0 - - 17 2.7   Mixed Apnea - - - - - - - -   Central Apnea - - - - - - - -   Total Apneas 17 2.7 16.8 28.0 - - 17 2.7  Hypopneas 3% 101 16.2 N.A. N.A. - - 101 16.2   Apneas & Hyp. 3% 118 18.9 N.A. N.A. - - 118 19.0            Hypopneas 4% 84 13.5 N.A. N.A. - - 84 13.5  Apneas & Hyp. 4% 101 16.2 N.A. N.A. - - 101 16.2             RERAs - - - - - - - -  RDI 118 18.9 N.A. N.A. - - 118 19.0   Oxygen Saturation Summary   Total Supine Non-Supine  Average SpO2 93.4% 96.1% 93.4%  Minimum SpO2 79.0% 94.0% 79.0%   Maximum SpO2 99.0% 98.0% 99.0%   Oxygen Saturation Distribution  Range (%) Time in  range (min) Time in range (%)  90.0 - 100.0 335.3 89.7%  80.0 - 90.0 38.1 10.2%  70.0 - 80.0 0.6 0.2%  60.0 - 70.0 - -  50.0 - 60.0 - -  0.0 - 50.0 - -  Time Spent <=88% SpO2  Range (%) Time in range (min) Time in range (%)  0.0 - 88.0 21.0 5.6%  Cardiac Summary   Total Supine Non-Supine  Average Pulse Rate (BPM) 86.7 93.0 86.7  Minimum Pulse Rate (BPM) 56.0 86.0 56.0  Maximum Pulse Rate (BPM) 118.0 104.0 118.0                     Technologist Comments  -                        Reggy Neysa Bateman, Biomedical Engineer of Sleep Medicine  ELECTRONICALLY SIGNED ON:  03/11/2024, 12:02 PM [facility] PH: [phone]   FX: [phone] ACCREDITED BY THE AMERICAN ACADEMY OF SLEEP MEDICINE

## 2024-03-14 ENCOUNTER — Telehealth: Payer: Self-pay

## 2024-03-14 DIAGNOSIS — G4733 Obstructive sleep apnea (adult) (pediatric): Secondary | ICD-10-CM

## 2024-03-14 NOTE — Telephone Encounter (Signed)
 Called the pt and there was no answer, his voicemail box was unfortunately full so unable to leave msg  Will call back

## 2024-03-14 NOTE — Telephone Encounter (Signed)
 Date of Study: 03/11/24  Interpretation: Moderate OSA with AHI of 16.2/hr, O2 desaturation 21 min. O2 nadir 79%.   Plan: Call patient to see if he would like to 1st follow up with a sleep provider or directly get CPAP. If he needs more education please schedule an appointment with sleep provider. If wants a CPAP then follow below instruction:  Initiate auto CPAP at 6-15 cm H2O and provide supplies.  Side sleep.   Sammi Fredericks, MD.

## 2024-03-20 NOTE — Telephone Encounter (Signed)
 Called and spoke to pt - advised of Sleep Study results per Dr. Theodoro. Pt stated that he would like to proceed with CPAP Therapy.  CPAP ordered. Pt has been scheduled for a f/u visit on 05/21/24 for compliance check-in. Per pt's request, letter with appt date and time has been mailed. NFN.

## 2024-03-20 NOTE — Addendum Note (Signed)
 Addended byBETHA JESSICA BOUCHARD S on: 03/20/2024 03:07 PM   Modules accepted: Orders

## 2024-05-21 ENCOUNTER — Ambulatory Visit
